# Patient Record
Sex: Female | Born: 1980 | Race: White | Hispanic: No | Marital: Single | Smoking: Never smoker
Health system: Southern US, Community
[De-identification: ages and names within clinical notes are randomized; demographics above are authoritative.]

## PROBLEM LIST (undated history)

## (undated) DIAGNOSIS — F419 Anxiety disorder, unspecified: Secondary | ICD-10-CM

## (undated) DIAGNOSIS — S0291XA Unspecified fracture of skull, initial encounter for closed fracture: Secondary | ICD-10-CM

## (undated) DIAGNOSIS — S0990XA Unspecified injury of head, initial encounter: Secondary | ICD-10-CM

## (undated) DIAGNOSIS — F329 Major depressive disorder, single episode, unspecified: Secondary | ICD-10-CM

## (undated) DIAGNOSIS — F32A Depression, unspecified: Secondary | ICD-10-CM

## (undated) DIAGNOSIS — S069X9A Unspecified intracranial injury with loss of consciousness of unspecified duration, initial encounter: Secondary | ICD-10-CM

## (undated) HISTORY — DX: Major depressive disorder, single episode, unspecified: F32.9

## (undated) HISTORY — DX: Anxiety disorder, unspecified: F41.9

## (undated) HISTORY — DX: Unspecified injury of head, initial encounter: S09.90XA

## (undated) HISTORY — DX: Depression, unspecified: F32.A

## (undated) HISTORY — DX: Unspecified intracranial injury with loss of consciousness of unspecified duration, initial encounter: S06.9X9A

## (undated) HISTORY — DX: Unspecified fracture of skull, initial encounter for closed fracture: S02.91XA

---
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
## 2006-06-02 ENCOUNTER — Emergency Department (HOSPITAL_COMMUNITY): Admission: EM | Admit: 2006-06-02 | Discharge: 2006-06-02 | Payer: Self-pay | Admitting: *Deleted

## 2006-10-21 ENCOUNTER — Emergency Department (HOSPITAL_COMMUNITY): Admission: EM | Admit: 2006-10-21 | Discharge: 2006-10-21 | Payer: Self-pay | Admitting: Emergency Medicine
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
## 2009-04-11 ENCOUNTER — Emergency Department (HOSPITAL_COMMUNITY): Admission: EM | Admit: 2009-04-11 | Discharge: 2009-04-11 | Payer: Self-pay | Admitting: Emergency Medicine

## 2010-06-13 ENCOUNTER — Emergency Department (HOSPITAL_COMMUNITY)
Admission: EM | Admit: 2010-06-13 | Discharge: 2010-06-14 | Payer: Self-pay | Source: Home / Self Care | Admitting: Emergency Medicine

## 2010-06-16 ENCOUNTER — Emergency Department (HOSPITAL_COMMUNITY)
Admission: EM | Admit: 2010-06-16 | Discharge: 2010-06-16 | Payer: Self-pay | Source: Home / Self Care | Admitting: Family Medicine

## 2011-04-01 LAB — POCT PREGNANCY, URINE: Preg Test, Ur: NEGATIVE

## 2011-04-01 LAB — POCT URINALYSIS DIP (DEVICE)
Glucose, UA: NEGATIVE mg/dL
Ketones, ur: NEGATIVE mg/dL
Nitrite: NEGATIVE
Specific Gravity, Urine: 1.02 (ref 1.005–1.030)
pH: 5 (ref 5.0–8.0)

## 2015-09-10 ENCOUNTER — Ambulatory Visit (INDEPENDENT_AMBULATORY_CARE_PROVIDER_SITE_OTHER): Payer: Commercial Managed Care - PPO | Admitting: Family Medicine

## 2015-09-10 VITALS — BP 128/78 | HR 91 | Temp 97.7°F | Resp 16 | Ht 70.0 in | Wt 180.8 lb

## 2015-09-10 DIAGNOSIS — L989 Disorder of the skin and subcutaneous tissue, unspecified: Secondary | ICD-10-CM

## 2015-09-10 DIAGNOSIS — Z23 Encounter for immunization: Secondary | ICD-10-CM | POA: Diagnosis not present

## 2015-09-10 DIAGNOSIS — L679 Hair color and hair shaft abnormality, unspecified: Secondary | ICD-10-CM

## 2015-09-10 DIAGNOSIS — L678 Other hair color and hair shaft abnormalities: Secondary | ICD-10-CM | POA: Diagnosis not present

## 2015-09-10 DIAGNOSIS — R5383 Other fatigue: Secondary | ICD-10-CM

## 2015-09-10 DIAGNOSIS — L819 Disorder of pigmentation, unspecified: Secondary | ICD-10-CM

## 2015-09-10 DIAGNOSIS — R42 Dizziness and giddiness: Secondary | ICD-10-CM | POA: Diagnosis not present

## 2015-09-10 DIAGNOSIS — Z0000 Encounter for general adult medical examination without abnormal findings: Secondary | ICD-10-CM | POA: Diagnosis not present

## 2015-09-10 DIAGNOSIS — Z87828 Personal history of other (healed) physical injury and trauma: Secondary | ICD-10-CM | POA: Diagnosis not present

## 2015-09-10 DIAGNOSIS — L853 Xerosis cutis: Secondary | ICD-10-CM

## 2015-09-10 DIAGNOSIS — R0989 Other specified symptoms and signs involving the circulatory and respiratory systems: Secondary | ICD-10-CM

## 2015-09-10 DIAGNOSIS — J3489 Other specified disorders of nose and nasal sinuses: Secondary | ICD-10-CM | POA: Diagnosis not present

## 2015-09-10 DIAGNOSIS — R238 Other skin changes: Secondary | ICD-10-CM

## 2015-09-10 MED ORDER — FLUTICASONE PROPIONATE 50 MCG/ACT NA SUSP: 1.0000 | Freq: Every day | NASAL | Status: AC

## 2015-09-10 NOTE — Progress Notes (Signed)
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 detection of breast cancer.  For more information and instruction on self breast exams, ask your healthcare provider or [email or web address].  Eat a healthy diet- Eat a variety of healthy foods such as fruits, vegetables, whole grains, low fat milk, low fat cheeses, yogurt, lean meats, poultry and fish, beans, nuts, tofu, etc.  For more information go to www. Thenutritionsource.org  Drink alcohol in moderation- Limit alcohol intake to one drink or less per day. Never drink and drive.  Depression- Your emotional health is as important as your physical health.  If you're feeling down or losing interest in things you normally enjoy please talk to your healthcare  provider about being screened for depression.  Dental visit- Brush and floss your teeth twice daily; visit your dentist twice a  year.  Eye doctor- Get an eye exam at least every 2 years.  Helmet use- Always wear a helmet when riding a bicycle, motorcycle, rollerblading or skateboarding.  Safe sex- If you may be exposed to sexually transmitted infections, use a condom.  Seat belts- Seat belts can save your live; always wear one.  Smoke/Carbon Monoxide detectors- These detectors need to be installed on the appropriate level of your home. Replace batteries at least once a year.  Skin cancer- When out in the sun please cover up and use sunscreen 15 SPF or higher.  Violence- If anyone is threatening or hurting you, please tell your healthcare provider.    Tdap Vaccine (Tetanus, Diphtheria, Pertussis): What You Need to Know 1. Why get vaccinated? Tetanus, diphtheria and pertussis can be very serious diseases, even for adolescents and adults. Tdap vaccine can protect [place] from these diseases. TETANUS (Lockjaw) causes painful muscle tightening and stiffness, usually all over the body.  It can lead to tightening of muscles in the head and neck so you can't open your mouth, swallow, or sometimes even breathe. Tetanus kills about 1 out of 5 people who are infected. DIPHTHERIA can cause a thick coating to form in the back of the throat.  It can lead to breathing problems, paralysis, heart failure, and death. PERTUSSIS (Whooping Cough) causes severe coughing spells, which can cause difficulty breathing, vomiting and disturbed sleep.  It can also lead to weight loss, incontinence, and rib fractures. Up to 2 in 100 adolescents and 5 in 100 adults with pertussis are hospitalized or have complications, which could include pneumonia or death. These diseases are caused by bacteria. Diphtheria and pertussis are spread from person to person through coughing or sneezing. Tetanus enters the body through cuts, scratches, or wounds. Before vaccines, the [place] saw as many as 200,000 cases a year of diphtheria and pertussis, and  hundreds of cases of tetanus. Since vaccination began, tetanus and diphtheria have dropped by about 99% and pertussis by about 80%. 2. Tdap vaccine Tdap vaccine can protect adolescents and adults from tetanus, diphtheria, and pertussis. One dose of Tdap is routinely given at age 55 or 29. People who did not get Tdap at that age should get it as soon as possible. Tdap is especially important for health care professionals and anyone having close contact with a baby younger than 12 months. Pregnant women should get a dose of Tdap during every pregnancy, to protect the newborn from pertussis. Infants are most at risk for severe, life-threatening complications from pertussis. A similar vaccine, called Td, protects from tetanus and diphtheria, but not pertussis. A Td booster should be given every 10 years. Tdap may be given as one of these boosters if you have not already gotten a dose. Tdap may also be given after a severe cut or burn to prevent tetanus infection. Your doctor can give you more information. Tdap may safely be given at the same time as other vaccines. 3. Some people should not get this vaccine  If you ever had a life-threatening allergic reaction after a dose of any tetanus, diphtheria, or pertussis containing vaccine, OR if you have a severe allergy to any part of this vaccine, you should not get Tdap. Tell your doctor if you have any severe allergies.  If you had a coma, or long or multiple seizures within 7 days after  a childhood dose of DTP or DTaP, you should not get Tdap, unless a cause other than the vaccine was found. You can still get Td.  Talk to your doctor if you:  have epilepsy or another nervous system problem,  had severe pain or swelling after any vaccine containing diphtheria, tetanus or pertussis,  ever had Guillain-Barr Syndrome (GBS),  aren't feeling well on the day the shot is scheduled. 4. Risks of a vaccine reaction With any medicine, including vaccines,  there is a chance of side effects. These are usually mild and go away on their own, but serious reactions are also possible. Brief fainting spells can follow a vaccination, leading to injuries from falling. Sitting or lying down for about 15 minutes can help prevent these. Tell your doctor if you feel dizzy or light-headed, or have vision changes or ringing in the ears. Mild problems following Tdap (Did not interfere with activities)  Pain where the shot was given (about 3 in 4 adolescents or 2 in 3 adults)  Redness or swelling where the shot was given (about 1 person in 5)  Mild fever of at least 100.45F (up to about 1 in 25 adolescents or 1 in 100 adults)  Headache (about 3 or 4 people in 10)  Tiredness (about 1 person in 3 or 4)  Nausea, vomiting, diarrhea, stomach ache (up to 1 in 4 adolescents or 1 in 10 adults)  Chills, body aches, sore joints, rash, swollen glands (uncommon) Moderate problems following Tdap (Interfered with activities, but did not require medical attention)  Pain where the shot was given (about 1 in 5 adolescents or 1 in 100 adults)  Redness or swelling where the shot was given (up to about 1 in 16 adolescents or 1 in 25 adults)  Fever over 102F (about 1 in 100 adolescents or 1 in 250 adults)  Headache (about 3 in 20 adolescents or 1 in 10 adults)  Nausea, vomiting, diarrhea, stomach ache (up to 1 or 3 people in 100)  Swelling of the entire arm where the shot was given (up to about 3 in 100). Severe problems following Tdap (Unable to perform usual activities; required medical attention)  Swelling, severe pain, bleeding and redness in the arm where the shot was given (rare). A severe allergic reaction could occur after any vaccine (estimated less than 1 in a million doses). 5. What if there is a serious reaction? What should I look for?  Look for anything that concerns you, such as signs of a severe allergic reaction, very high fever, or behavior  changes. Signs of a severe allergic reaction can include hives, swelling of the face and throat, difficulty breathing, a fast heartbeat, dizziness, and weakness. These would start a few minutes to a few hours after the vaccination. What should I do?  If you think it is a severe allergic reaction or other emergency that can't wait, call 9-1-1 or get the person to the nearest hospital. Otherwise, call your doctor.  Afterward, the reaction should be reported to the "Vaccine Adverse Event Reporting System" (VAERS). Your doctor might file this report, or you can do it yourself through the VAERS web site at www.vaers.LAgents.no, or by calling 1-206-602-1315. VAERS is only for reporting reactions. They do not give medical advice.  6. The National Vaccine Injury Compensation Program The [employer] Vaccine Injury Compensation Program (VICP) is a federal program that was created to compensate people who may have been injured by certain vaccines. Persons who believe they may have  been injured by a vaccine can learn about the program and about filing a claim by calling 1-380 591 7375 or visiting the VICP website at SpiritualWord.at. 7. How can I learn more?  Ask your doctor.  Call your local or state health department.  Contact the Centers for Disease Control and Prevention (CDC):  Call 941-259-8912 or visit CDC's website at PicCapture.uy. CDC Tdap Vaccine VIS (04/28/12) Document Released: 06/07/2012 Document Revised: 04/23/2014 Document Reviewed: 03/21/2014 [employer] Patient Information 2015 [place], [place]. This information is not intended to replace advice given to you by your health care provider. Make sure you discuss any questions you have with your health care provider.        I personally performed the services described in this documentation, which was scribed in my presence. The recorded information has been reviewed and considered, and addended by me as needed.

## 2015-09-10 NOTE — Patient Instructions (Addendum)
Allegra once per day and flonase nasal spray for allergies/runny nose.  If not improving - I can refer you to ear nose and throat. Follow up to discuss this further.   For fatigue - will check thyroid test, electrolytes, blood count.  Follow up to discuss this further. You should receive a call or letter about your lab results within the next week to 10 days.   Call a dentist to schedule an evaluation for your tooth issues. If they feel other testing needed - let me know.    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   I suspect anxiety or possible depression may be contributing to your symptoms, including your difficulty with sleeping. I would recommend evaluation with psychologist to look into your symptoms further.Depending on that evaluation, may recommend psychiatrist as well. See information below on these conditions.   Counsellors: Karmen Bongo: [phone] or [facility] Ped: [phone]  [facility].   Follow up to discuss other concerns on your list at future visits in next few weeks.  Return to the clinic or go to the nearest emergency room if any of your symptoms worsen or new symptoms occur.    Generalized Anxiety Disorder Generalized anxiety disorder (GAD) is a mental disorder. It interferes with life functions, including relationships, work, and school. GAD is different from normal anxiety, which everyone experiences at some point in their lives in response to specific life events and activities. Normal anxiety actually helps [place] prepare for and get through these life events and activities. Normal anxiety goes away after the event or activity is over.  GAD causes anxiety that is not necessarily related to specific events or activities. It also causes excess anxiety in proportion to specific events or activities. The anxiety associated  with GAD is also difficult to control. GAD can vary from mild to severe. People with severe GAD can have intense waves of anxiety with physical symptoms (panic attacks).  SYMPTOMS The anxiety and worry associated with GAD are difficult to control. This anxiety and worry are related to many life events and activities and also occur more days than not for 6 months or longer. People with GAD also have three or more of the following symptoms (one or more in children):  Restlessness.   Fatigue.  Difficulty concentrating.   Irritability.  Muscle tension.  Difficulty sleeping or unsatisfying sleep. DIAGNOSIS GAD is diagnosed through an assessment by your health care provider. Your health care provider will ask you questions aboutyour mood,physical symptoms, and events in your life. Your health care provider may ask you about your medical history and use of alcohol or drugs, including prescription medicines. Your health care provider may also do a physical exam and blood tests. Certain medical conditions and the use of certain substances can cause symptoms similar to those associated with GAD. Your health care provider may refer you to a mental health specialist for further evaluation. TREATMENT The following therapies are usually used to treat GAD:   Medication. Antidepressant medication usually is prescribed for long-term daily control. Antianxiety medicines may be added in severe cases, especially when panic attacks occur.   Talk therapy (psychotherapy). Certain types of talk therapy can be helpful in treating GAD by providing support, education, and guidance. A form of talk therapy called cognitive behavioral therapy can teach you healthy ways to think about and  react to daily life events and activities.  Stress managementtechniques. These include yoga, meditation, and exercise and can be very helpful when they are practiced regularly. A mental health specialist can help determine which  treatment is best for you. Some people see improvement with one therapy. However, other people require a combination of therapies. Document Released: 04/03/2013 Document Revised: 04/23/2014 Document Reviewed: 04/03/2013 [facility] Patient Information 2015 [place], [place]. This information is not intended to replace advice given to you by your health care provider. Make sure you discuss any questions you have with your health care provider.   Depression Depression refers to feeling sad, low, down in the dumps, blue, gloomy, or empty. In general, there are two kinds of depression:  Normal sadness or normal grief. This kind of depression is one that we all feel from time to time after upsetting life experiences, such as the loss of a job or the ending of a relationship. This kind of depression is considered normal, is short lived, and resolves within a few days to 2 weeks. Depression experienced after the loss of a loved one (bereavement) often lasts longer than 2 weeks but normally gets better with time.  Clinical depression. This kind of depression lasts longer than normal sadness or normal grief or interferes with your ability to function at home, at work, and in school. It also interferes with your personal relationships. It affects almost every aspect of your life. Clinical depression is an illness. Symptoms of depression can also be caused by conditions other than those mentioned above, such as:  Physical illness. Some physical illnesses, including underactive thyroid gland (hypothyroidism), severe anemia, specific types of cancer, diabetes, uncontrolled seizures, heart and lung problems, strokes, and chronic pain are commonly associated with symptoms of depression.  Side effects of some prescription medicine. In some people, certain types of medicine can cause symptoms of depression.  Substance abuse. Abuse of alcohol and illicit drugs can cause symptoms of depression. SYMPTOMS Symptoms of  normal sadness and normal grief include the following:  Feeling sad or crying for short periods of time.  Not caring about anything (apathy).  Difficulty sleeping or sleeping too much.  No longer able to enjoy the things you used to enjoy.  Desire to be by oneself all the time (social isolation).  Lack of energy or motivation.  Difficulty concentrating or remembering.  Change in appetite or weight.  Restlessness or agitation. Symptoms of clinical depression include the same symptoms of normal sadness or normal grief and also the following symptoms:  Feeling sad or crying all the time.  Feelings of guilt or worthlessness.  Feelings of hopelessness or helplessness.  Thoughts of suicide or the desire to harm yourself (suicidal ideation).  Loss of touch with reality (psychotic symptoms). Seeing or hearing things that are not real (hallucinations) or having false beliefs about your life or the people around you (delusions and paranoia). DIAGNOSIS  The diagnosis of clinical depression is usually based on how bad the symptoms are and how long they have lasted. Your health care provider will also ask you questions about your medical history and substance use to find out if physical illness, use of prescription medicine, or substance abuse is causing your depression. Your health care provider may also order blood tests. TREATMENT  Often, normal sadness and normal grief do not require treatment. However, sometimes antidepressant medicine is given for bereavement to ease the depressive symptoms until they resolve. The treatment for clinical depression depends on how bad the symptoms  are but often includes antidepressant medicine, counseling with a mental health professional, or both. Your health care provider will help to determine what treatment is best for you. Depression caused by physical illness usually goes away with appropriate medical treatment of the illness. If prescription medicine  is causing depression, talk with your health care provider about stopping the medicine, decreasing the dose, or changing to another medicine. Depression caused by the abuse of alcohol or illicit drugs goes away when you stop using these substances. Some adults need professional help in order to stop drinking or using drugs. SEEK IMMEDIATE MEDICAL CARE IF:  You have thoughts about hurting yourself or others.  You lose touch with reality (have psychotic symptoms).  You are taking medicine for depression and have a serious side effect. FOR MORE INFORMATION  National Alliance on Mental Illness: www.nami.[employer] of Mental Health: http://www.maynard.net/ Document Released: 12/04/2000 Document Revised: 04/23/2014 Document Reviewed: 03/07/2012 [facility] Patient Information 2015 [place], [place]. This information is not intended to replace advice given to you by your health care provider. Make sure you discuss any questions you have with your health care provider.  Fatigue Fatigue is a feeling of tiredness, lack of energy, lack of motivation, or feeling tired all the time. Having enough rest, good nutrition, and reducing stress will normally reduce fatigue. Consult your caregiver if it persists. The nature of your fatigue will help your caregiver to find out its cause. The treatment is based on the cause.  CAUSES  There are many causes for fatigue. Most of the time, fatigue can be traced to one or more of your habits or routines. Most causes fit into one or more of three general areas. They are: Lifestyle problems  Sleep disturbances.  Overwork.  Physical exertion.  Unhealthy habits.  Poor eating habits or eating disorders.  Alcohol and/or drug use .  Lack of proper nutrition (malnutrition). Psychological problems  Stress and/or anxiety problems.  Depression.  Grief.  Boredom. Medical Problems or Conditions  Anemia.  Pregnancy.  Thyroid gland problems.  Recovery  from major surgery.  Continuous pain.  Emphysema or asthma that is not well controlled  Allergic conditions.  Diabetes.  Infections (such as mononucleosis).  Obesity.  Sleep disorders, such as sleep apnea.  Heart failure or other heart-related problems.  Cancer.  Kidney disease.  Liver disease.  Effects of certain medicines such as antihistamines, cough and cold remedies, prescription pain medicines, heart and blood pressure medicines, drugs used for treatment of cancer, and some antidepressants. SYMPTOMS  The symptoms of fatigue include:   Lack of energy.  Lack of drive (motivation).  Drowsiness.  Feeling of indifference to the surroundings. DIAGNOSIS  The details of how you feel help guide your caregiver in finding out what is causing the fatigue. You will be asked about your present and past health condition. It is important to review all medicines that you take, including prescription and non-prescription items. A thorough exam will be done. You will be questioned about your feelings, habits, and normal lifestyle. Your caregiver may suggest blood tests, urine tests, or other tests to look for common medical causes of fatigue.  TREATMENT  Fatigue is treated by correcting the underlying cause. For example, if you have continuous pain or depression, treating these causes will improve how you feel. Similarly, adjusting the dose of certain medicines will help in reducing fatigue.  HOME CARE INSTRUCTIONS   Try to get the required amount of good sleep every night.  Eat a  healthy and nutritious diet, and drink enough water throughout the day.  Practice ways of relaxing (including yoga or meditation).  Exercise regularly.  Make plans to change situations that cause stress. Act on those plans so that stresses decrease over time. Keep your work and personal routine reasonable.  Avoid street drugs and minimize use of alcohol.  Start taking a daily multivitamin after  consulting your caregiver. SEEK MEDICAL CARE IF:   You have persistent tiredness, which cannot be accounted for.  You have fever.  You have unintentional weight loss.  You have headaches.  You have disturbed sleep throughout the night.  You are feeling sad.  You have constipation.  You have dry skin.  You have gained weight.  You are taking any new or different medicines that you suspect are causing fatigue.  You are unable to sleep at night.  You develop any unusual swelling of your legs or other parts of your body. SEEK IMMEDIATE MEDICAL CARE IF:   You are feeling confused.  Your vision is blurred.  You feel faint or pass out.  You develop severe headache.  You develop severe abdominal, pelvic, or back pain.  You develop chest pain, shortness of breath, or an irregular or fast heartbeat.  You are unable to pass a normal amount of urine.  You develop abnormal bleeding such as bleeding from the rectum or you vomit blood.  You have thoughts about harming yourself or committing suicide.  You are worried that you might harm someone else. MAKE SURE YOU:   Understand these instructions.  Will watch your condition.  Will get help right away if you are not doing well or get worse. Document Released: 10/04/2007 Document Revised: 02/29/2012 Document Reviewed: 04/10/2014 [facility] Patient Information 2015 [place], [place]. This information is not intended to replace advice given to you by your health care provider. Make sure you discuss any questions you have with your health care provider.   Keeping You Healthy  Get These Tests  Blood Pressure- Have your blood pressure checked once a year by your health care provider.  Normal blood pressure is 120/80.  Weight- Have your body mass index (BMI) calculated to screen for obesity.  BMI is measure of body fat based on height and weight.  You can also calculate your own BMI at https://www.west-esparza.com/.  Cholesterol-  Have your cholesterol checked every 5 years starting at age 18 then yearly starting at age 18.  Chlamydia, HIV, and other sexually transmitted diseases- Get screened every year until age 34, then within three months of each new sexual provider.  Pap Test - Every 1-5 years; discuss with your health care provider.  Mammogram- Every 1-2 years starting at age 87--50  Take these medicines  Calcium with Vitamin D-Your body needs 1200 mg of Calcium each day and [phone] IU of Vitamin D daily.  Your body can only absorb 500 mg of Calcium at a time so Calcium must be taken in 2 or 3 divided doses throughout the day.  Multivitamin with folic acid- Once daily if it is possible for you to become pregnant.  Get these Immunizations  Gardasil-Series of three doses; prevents HPV related illness such as genital warts and cervical cancer.  Menactra-Single dose; prevents meningitis.  Tetanus shot- Every 10 years.  Flu shot-Every year.  Take these steps  Do not smoke-Your healthcare provider can help you quit.  For tips on how to quit go to www.smokefree.gov or call 1-800 QUITNOW.  Be physically active- Exercise 5  days a week for at least 30 minutes.  If you are not already physically active, start slow and gradually work up to 30 minutes of moderate physical activity.  Examples of moderate activity include walking briskly, dancing, swimming, bicycling, etc.  Breast Cancer- A self breast exam every month is important for early detection of breast cancer.  For more information and instruction on self breast exams, ask your healthcare provider or [email or web address].  Eat a healthy diet- Eat a variety of healthy foods such as fruits, vegetables, whole grains, low fat milk, low fat cheeses, yogurt, lean meats, poultry and fish, beans, nuts, tofu, etc.  For more information go to www. Thenutritionsource.org  Drink alcohol in moderation- Limit alcohol intake to one drink or less  per day. Never drink and drive.  Depression- Your emotional health is as important as your physical health.  If you're feeling down or losing interest in things you normally enjoy please talk to your healthcare provider about being screened for depression.  Dental visit- Brush and floss your teeth twice daily; visit your dentist twice a year.  Eye doctor- Get an eye exam at least every 2 years.  Helmet use- Always wear a helmet when riding a bicycle, motorcycle, rollerblading or skateboarding.  Safe sex- If you may be exposed to sexually transmitted infections, use a condom.  Seat belts- Seat belts can save your live; always wear one.  Smoke/Carbon Monoxide detectors- These detectors need to be installed on the appropriate level of your home. Replace batteries at least once a year.  Skin cancer- When out in the sun please cover up and use sunscreen 15 SPF or higher.  Violence- If anyone is threatening or hurting you, please tell your healthcare provider.    Tdap Vaccine (Tetanus, Diphtheria, Pertussis): What You Need to Know 1. Why get vaccinated? Tetanus, diphtheria and pertussis can be very serious diseases, even for adolescents and adults. Tdap vaccine can protect [place] from these diseases. TETANUS (Lockjaw) causes painful muscle tightening and stiffness, usually all over the body.  It can lead to tightening of muscles in the head and neck so you can't open your mouth, swallow, or sometimes even breathe. Tetanus kills about 1 out of 5 people who are infected. DIPHTHERIA can cause a thick coating to form in the back of the throat.  It can lead to breathing problems, paralysis, heart failure, and death. PERTUSSIS (Whooping Cough) causes severe coughing spells, which can cause difficulty breathing, vomiting and disturbed sleep.  It can also lead to weight loss, incontinence, and rib fractures. Up to 2 in 100 adolescents and 5 in 100 adults with pertussis are hospitalized or have  complications, which could include pneumonia or death. These diseases are caused by bacteria. Diphtheria and pertussis are spread from person to person through coughing or sneezing. Tetanus enters the body through cuts, scratches, or wounds. Before vaccines, the [place] saw as many as 200,000 cases a year of diphtheria and pertussis, and hundreds of cases of tetanus. Since vaccination began, tetanus and diphtheria have dropped by about 99% and pertussis by about 80%. 2. Tdap vaccine Tdap vaccine can protect adolescents and adults from tetanus, diphtheria, and pertussis. One dose of Tdap is routinely given at age 44 or 62. People who did not get Tdap at that age should get it as soon as possible. Tdap is especially important for health care professionals and anyone having close contact with a baby younger than 12 months. Pregnant women should get a dose of  Tdap during every pregnancy, to protect the newborn from pertussis. Infants are most at risk for severe, life-threatening complications from pertussis. A similar vaccine, called Td, protects from tetanus and diphtheria, but not pertussis. A Td booster should be given every 10 years. Tdap may be given as one of these boosters if you have not already gotten a dose. Tdap may also be given after a severe cut or burn to prevent tetanus infection. Your doctor can give you more information. Tdap may safely be given at the same time as other vaccines. 3. Some people should not get this vaccine  If you ever had a life-threatening allergic reaction after a dose of any tetanus, diphtheria, or pertussis containing vaccine, OR if you have a severe allergy to any part of this vaccine, you should not get Tdap. Tell your doctor if you have any severe allergies.  If you had a coma, or long or multiple seizures within 7 days after a childhood dose of DTP or DTaP, you should not get Tdap, unless a cause other than the vaccine was found. You can still get  Td.  Talk to your doctor if you:  have epilepsy or another nervous system problem,  had severe pain or swelling after any vaccine containing diphtheria, tetanus or pertussis,  ever had Guillain-Barr Syndrome (GBS),  aren't feeling well on the day the shot is scheduled. 4. Risks of a vaccine reaction With any medicine, including vaccines, there is a chance of side effects. These are usually mild and go away on their own, but serious reactions are also possible. Brief fainting spells can follow a vaccination, leading to injuries from falling. Sitting or lying down for about 15 minutes can help prevent these. Tell your doctor if you feel dizzy or light-headed, or have vision changes or ringing in the ears. Mild problems following Tdap (Did not interfere with activities)  Pain where the shot was given (about 3 in 4 adolescents or 2 in 3 adults)  Redness or swelling where the shot was given (about 1 person in 5)  Mild fever of at least 100.66F (up to about 1 in 25 adolescents or 1 in 100 adults)  Headache (about 3 or 4 people in 10)  Tiredness (about 1 person in 3 or 4)  Nausea, vomiting, diarrhea, stomach ache (up to 1 in 4 adolescents or 1 in 10 adults)  Chills, body aches, sore joints, rash, swollen glands (uncommon) Moderate problems following Tdap (Interfered with activities, but did not require medical attention)  Pain where the shot was given (about 1 in 5 adolescents or 1 in 100 adults)  Redness or swelling where the shot was given (up to about 1 in 16 adolescents or 1 in 25 adults)  Fever over 102F (about 1 in 100 adolescents or 1 in 250 adults)  Headache (about 3 in 20 adolescents or 1 in 10 adults)  Nausea, vomiting, diarrhea, stomach ache (up to 1 or 3 people in 100)  Swelling of the entire arm where the shot was given (up to about 3 in 100). Severe problems following Tdap (Unable to perform usual activities; required medical attention)  Swelling, severe pain,  bleeding and redness in the arm where the shot was given (rare). A severe allergic reaction could occur after any vaccine (estimated less than 1 in a million doses). 5. What if there is a serious reaction? What should I look for?  Look for anything that concerns you, such as signs of a severe allergic reaction, very  high fever, or behavior changes. Signs of a severe allergic reaction can include hives, swelling of the face and throat, difficulty breathing, a fast heartbeat, dizziness, and weakness. These would start a few minutes to a few hours after the vaccination. What should I do?  If you think it is a severe allergic reaction or other emergency that can't wait, call 9-1-1 or get the person to the nearest hospital. Otherwise, call your doctor.  Afterward, the reaction should be reported to the "Vaccine Adverse Event Reporting System" (VAERS). Your doctor might file this report, or you can do it yourself through the VAERS web site at www.vaers.LAgents.no, or by calling 1-(919)755-8992. VAERS is only for reporting reactions. They do not give medical advice.  6. The National Vaccine Injury Compensation Program The [employer] Vaccine Injury Compensation Program (VICP) is a federal program that was created to compensate people who may have been injured by certain vaccines. Persons who believe they may have been injured by a vaccine can learn about the program and about filing a claim by calling 1-785-786-1330 or visiting the VICP website at SpiritualWord.at. 7. How can I learn more?  Ask your doctor.  Call your local or state health department.  Contact the Centers for Disease Control and Prevention (CDC):  Call 508-856-8879 or visit CDC's website at PicCapture.uy. CDC Tdap Vaccine VIS (04/28/12) Document Released: 06/07/2012 Document Revised: 04/23/2014 Document Reviewed: 03/21/2014 [employer] Patient Information 2015 [place], [place]. This information is not intended to  replace advice given to you by your health care provider. Make sure you discuss any questions you have with your health care provider.

## 2015-09-11 LAB — CBC
HCT: 40.5 % (ref 36.0–46.0)
HEMOGLOBIN: 14.6 g/dL (ref 12.0–15.0)
MCH: 31.7 pg (ref 26.0–34.0)
MCHC: 36 g/dL (ref 30.0–36.0)
MCV: 87.9 fL (ref 78.0–100.0)
MPV: 9.6 fL (ref 8.6–12.4)
Platelets: 315 10*3/uL (ref 150–400)
RBC: 4.61 MIL/uL (ref 3.87–5.11)
RDW: 13.3 % (ref 11.5–15.5)
WBC: 7.1 10*3/uL (ref 4.0–10.5)

## 2015-09-11 LAB — COMPLETE METABOLIC PANEL WITH GFR
ALBUMIN: 4.2 g/dL (ref 3.6–5.1)
ALK PHOS: 40 U/L (ref 33–115)
ALT: 16 U/L (ref 6–29)
AST: 18 U/L (ref 10–30)
BILIRUBIN TOTAL: 0.8 mg/dL (ref 0.2–1.2)
BUN: 7 mg/dL (ref 7–25)
CO2: 28 mmol/L (ref 20–31)
Calcium: 9.3 mg/dL (ref 8.6–10.2)
Chloride: 102 mmol/L (ref 98–110)
Creat: 0.78 mg/dL (ref 0.50–1.10)
Glucose, Bld: 74 mg/dL (ref 65–99)
Potassium: 4.6 mmol/L (ref 3.5–5.3)
Sodium: 137 mmol/L (ref 135–146)
TOTAL PROTEIN: 7.2 g/dL (ref 6.1–8.1)

## 2015-09-11 LAB — TSH: TSH: 1.385 u[IU]/mL (ref 0.350–4.500)

## 2015-09-27 ENCOUNTER — Encounter: Payer: Self-pay | Admitting: Neurology

## 2015-09-27 ENCOUNTER — Ambulatory Visit (INDEPENDENT_AMBULATORY_CARE_PROVIDER_SITE_OTHER): Payer: Commercial Managed Care - PPO | Admitting: Neurology

## 2015-09-27 VITALS — BP 132/86 | HR 79 | Ht 70.0 in | Wt 178.4 lb

## 2015-09-27 DIAGNOSIS — H938X3 Other specified disorders of ear, bilateral: Secondary | ICD-10-CM | POA: Diagnosis not present

## 2015-09-27 DIAGNOSIS — H938X9 Other specified disorders of ear, unspecified ear: Secondary | ICD-10-CM | POA: Insufficient documentation

## 2015-09-27 DIAGNOSIS — H532 Diplopia: Secondary | ICD-10-CM | POA: Diagnosis not present

## 2015-09-27 DIAGNOSIS — G2581 Restless legs syndrome: Secondary | ICD-10-CM

## 2015-09-27 DIAGNOSIS — H9313 Tinnitus, bilateral: Secondary | ICD-10-CM

## 2015-09-27 DIAGNOSIS — S0990XS Unspecified injury of head, sequela: Secondary | ICD-10-CM

## 2015-09-27 DIAGNOSIS — H9319 Tinnitus, unspecified ear: Secondary | ICD-10-CM | POA: Insufficient documentation

## 2015-09-27 DIAGNOSIS — R42 Dizziness and giddiness: Secondary | ICD-10-CM

## 2015-09-27 DIAGNOSIS — S0990XA Unspecified injury of head, initial encounter: Secondary | ICD-10-CM | POA: Insufficient documentation

## 2015-09-27 MED ORDER — NORTRIPTYLINE HCL 10 MG PO CAPS: 20.0000 mg | ORAL_CAPSULE | Freq: Every day | ORAL | Status: DC
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
## 2015-09-27 NOTE — Patient Instructions (Addendum)
Overall you are doing well but I do want to suggest a few things today:   Remember to drink plenty of fluid, eat healthy meals and do not skip any meals. Try to eat protein with a every meal and eat a healthy snack such as fruit or nuts in between meals. Try to keep a regular sleep-wake schedule and try to exercise daily, particularly in the form of walking, 20-30 minutes a day, if you can.   Start Nortriptyline  an hour before bed. In 2-4 weeks if needed can increase to . Will help with insomnia and headache.  As far as diagnostic testing: MRi of the brain, ENT referral, lab today  I would like to see you back as needed, sooner if we need to. Please call us with any interim questions, concerns, problems, updates or refill requests.   Our phone number is [phone]. We also have an after hours call service for urgent matters and there is a physician on-call for urgent questions. For any emergencies you know to call 911 or go to the nearest emergency room

## 2015-09-28 LAB — FERRITIN: FERRITIN: 27 ng/mL (ref 15–150)

## 2015-09-30 ENCOUNTER — Telehealth: Payer: Self-pay | Admitting: Neurology

## 2015-09-30 ENCOUNTER — Telehealth: Payer: Self-pay | Admitting: *Deleted

## 2015-09-30 NOTE — Telephone Encounter (Signed)
Tried calling work number. She was not there today per coworker.

## 2015-09-30 NOTE — Telephone Encounter (Signed)
See other phone note

## 2015-09-30 NOTE — Telephone Encounter (Signed)
Spoke w/ pt about normal ferritin level. She verbalized understanding. She was checking on status of referral to ENT. I told her it can take a week or so for them to contact her to schedule appt. She understands and knows to call back if she does not hear anything.

## 2015-09-30 NOTE — Telephone Encounter (Signed)
Calling pt about lab results. Could not leave message because VM not set up yet. Will try another number.

## 2015-09-30 NOTE — Telephone Encounter (Signed)
Pt returned your call about lab results please call pt back at home number dg

## 2015-09-30 NOTE — Telephone Encounter (Signed)
-----   Message from Anson Fret, MD sent at 09/29/2015  7:58 PM EDT ----- Let patient know her ferritin was normal thanks

## 2016-01-27 ENCOUNTER — Other Ambulatory Visit: Payer: Self-pay | Admitting: Neurology
